# Patient Record
Sex: Female | Born: 1985 | Race: White | Hispanic: No | Marital: Single | Smoking: Current every day smoker
Health system: Southern US, Community
[De-identification: ages and names within clinical notes are randomized; demographics above are authoritative.]

## PROBLEM LIST (undated history)

## (undated) DIAGNOSIS — R011 Cardiac murmur, unspecified: Secondary | ICD-10-CM

## (undated) DIAGNOSIS — F419 Anxiety disorder, unspecified: Secondary | ICD-10-CM

## (undated) HISTORY — DX: Anxiety disorder, unspecified: F41.9

## (undated) HISTORY — PX: EXCISION VAGINAL CYST: SHX5825

---

## 2010-12-11 DIAGNOSIS — F419 Anxiety disorder, unspecified: Secondary | ICD-10-CM | POA: Insufficient documentation

## 2014-02-07 ENCOUNTER — Ambulatory Visit (INDEPENDENT_AMBULATORY_CARE_PROVIDER_SITE_OTHER): Payer: 59 | Admitting: Obstetrics & Gynecology

## 2014-02-07 ENCOUNTER — Encounter: Payer: Self-pay | Admitting: Obstetrics & Gynecology

## 2014-02-07 VITALS — BP 118/69 | HR 86 | Ht 65.0 in | Wt 169.0 lb

## 2014-02-07 DIAGNOSIS — Z3492 Encounter for supervision of normal pregnancy, unspecified, second trimester: Secondary | ICD-10-CM

## 2014-02-07 DIAGNOSIS — L732 Hidradenitis suppurativa: Secondary | ICD-10-CM

## 2014-02-07 DIAGNOSIS — Z118 Encounter for screening for other infectious and parasitic diseases: Secondary | ICD-10-CM

## 2014-02-07 DIAGNOSIS — Z3490 Encounter for supervision of normal pregnancy, unspecified, unspecified trimester: Secondary | ICD-10-CM | POA: Insufficient documentation

## 2014-02-07 DIAGNOSIS — Z1151 Encounter for screening for human papillomavirus (HPV): Secondary | ICD-10-CM

## 2014-02-07 DIAGNOSIS — Z113 Encounter for screening for infections with a predominantly sexual mode of transmission: Secondary | ICD-10-CM

## 2014-02-07 DIAGNOSIS — Z124 Encounter for screening for malignant neoplasm of cervix: Secondary | ICD-10-CM

## 2014-02-07 LAB — OB RESULTS CONSOLE GBS: STREP GROUP B AG: POSITIVE

## 2014-02-07 MED ORDER — PRENATAL VITAMINS PLUS 27-1 MG PO TABS: 1.0000 | ORAL_TABLET | Freq: Every day | ORAL | Status: AC

## 2014-02-07 NOTE — Addendum Note (Signed)
Addended by: Granville LewisLARK, Maziah Smola L on: 02/07/2014 04:32 PM   Modules accepted: Orders

## 2014-02-07 NOTE — Progress Notes (Signed)
Pt unsure of dats.  HC measures 1978w4d today

## 2014-02-07 NOTE — Progress Notes (Signed)
   Subjective:    Jo Rios is a G2P1001 3645w4d being seen today for her first obstetrical visit.  Her obstetrical history is significant for late prenatal care. Patient does intend to breast feed. Pregnancy history fully reviewed.  Patient reports no complaints.  Filed Vitals:   02/07/14 1528 02/07/14 1537  BP: 118/69   Pulse: 86   Height:  5\' 5"  (1.651 m)  Weight: 169 lb (76.658 kg)     HISTORY: OB History  Gravida Para Term Preterm AB SAB TAB Ectopic Multiple Living  2 1 1       1     # Outcome Date GA Lbr Len/2nd Weight Sex Delivery Anes PTL Lv  2 CUR           1 TRM 02/01/05 6449w0d  6 lb 1 oz (2.75 kg) M SVD EPI N Y     Past Medical History  Diagnosis Date  . Anxiety    Past Surgical History  Procedure Laterality Date  . Excision vaginal cyst      2 surgeries   Family History  Problem Relation Age of Onset  . Stroke Maternal Grandfather      Exam    Uterus:     Pelvic Exam:    Perineum: No Hemorrhoids   Vulva: Healed scars from hydradenitis suppurativa   Vagina:  normal mucosa   pH: n/a   Cervix: no cervical motion tenderness and no lesions   Adnexa: normal adnexa   Bony Pelvis: average  System: Breast:  normal appearance, no masses or tenderness   Skin: normal coloration and turgor, no rashes    Neurologic: oriented, normal mood   Extremities: normal strength, tone, and muscle mass   HEENT sclera clear, anicteric, oropharynx clear, no lesions, neck supple with midline trachea and thyroid without masses   Mouth/Teeth mucous membranes moist, pharynx normal without lesions and dental hygiene good   Neck supple and no masses   Cardiovascular: regular rate and rhythm   Respiratory:  appears well, vitals normal, no respiratory distress, acyanotic, normal RR, ear and throat exam is normal, chest clear, no wheezing, crepitations, rhonchi, normal symmetric air entry   Abdomen: soft, non-tender; bowel sounds normal; no masses,  no organomegaly   Urinary: urethral meatus normal      Assessment:    Pregnancy: G2P1001 There are no active problems to display for this patient.       Plan:     Initial labs drawn. Prenatal vitamins. Problem list reviewed and updated. Genetic Screening discussed Quad Screen: declined.  Ultrasound discussed; fetal survey: ordered.  Follow up in 2 weeks. PNV  Bayani Renteria H. 02/07/2014

## 2014-02-08 LAB — OBSTETRIC PANEL
Antibody Screen: NEGATIVE
BASOS PCT: 1 % (ref 0–1)
Basophils Absolute: 0.1 10*3/uL (ref 0.0–0.1)
Eosinophils Absolute: 0.3 10*3/uL (ref 0.0–0.7)
Eosinophils Relative: 4 % (ref 0–5)
HCT: 32.2 % — ABNORMAL LOW (ref 36.0–46.0)
HEP B S AG: NEGATIVE
Hemoglobin: 10.9 g/dL — ABNORMAL LOW (ref 12.0–15.0)
LYMPHS ABS: 1.9 10*3/uL (ref 0.7–4.0)
Lymphocytes Relative: 24 % (ref 12–46)
MCH: 27.9 pg (ref 26.0–34.0)
MCHC: 33.9 g/dL (ref 30.0–36.0)
MCV: 82.6 fL (ref 78.0–100.0)
MONOS PCT: 7 % (ref 3–12)
Monocytes Absolute: 0.6 10*3/uL (ref 0.1–1.0)
NEUTROS ABS: 5.2 10*3/uL (ref 1.7–7.7)
Neutrophils Relative %: 64 % (ref 43–77)
PLATELETS: 215 10*3/uL (ref 150–400)
RBC: 3.9 MIL/uL (ref 3.87–5.11)
RDW: 14.1 % (ref 11.5–15.5)
Rh Type: POSITIVE
Rubella: 1.17 Index — ABNORMAL HIGH (ref <0.90)
WBC: 8.1 10*3/uL (ref 4.0–10.5)

## 2014-02-08 LAB — HIV ANTIBODY (ROUTINE TESTING W REFLEX): HIV 1&2 Ab, 4th Generation: NONREACTIVE

## 2014-02-09 LAB — CULTURE, URINE COMPREHENSIVE
Colony Count: NO GROWTH
Organism ID, Bacteria: NO GROWTH

## 2014-02-12 LAB — CYTOLOGY - PAP

## 2014-02-19 ENCOUNTER — Ambulatory Visit (HOSPITAL_COMMUNITY): Payer: 59

## 2014-03-01 ENCOUNTER — Other Ambulatory Visit: Payer: Self-pay | Admitting: Obstetrics & Gynecology

## 2014-03-01 ENCOUNTER — Ambulatory Visit (HOSPITAL_COMMUNITY)
Admission: RE | Admit: 2014-03-01 | Discharge: 2014-03-01 | Disposition: A | Discharge status: Home / Self Care | Payer: 59 | Source: Ambulatory Visit | Attending: Obstetrics & Gynecology | Admitting: Obstetrics & Gynecology

## 2014-03-01 DIAGNOSIS — Z3689 Encounter for other specified antenatal screening: Secondary | ICD-10-CM

## 2014-03-01 DIAGNOSIS — Z3A21 21 weeks gestation of pregnancy: Secondary | ICD-10-CM | POA: Diagnosis not present

## 2014-03-01 DIAGNOSIS — Z3492 Encounter for supervision of normal pregnancy, unspecified, second trimester: Secondary | ICD-10-CM

## 2014-03-01 DIAGNOSIS — Z36 Encounter for antenatal screening of mother: Secondary | ICD-10-CM | POA: Insufficient documentation

## 2014-03-05 ENCOUNTER — Encounter: Payer: Self-pay | Admitting: Obstetrics & Gynecology

## 2014-03-08 ENCOUNTER — Ambulatory Visit (INDEPENDENT_AMBULATORY_CARE_PROVIDER_SITE_OTHER): Payer: 59 | Admitting: Family

## 2014-03-08 VITALS — BP 116/71 | HR 91 | Wt 172.0 lb

## 2014-03-08 DIAGNOSIS — Z3492 Encounter for supervision of normal pregnancy, unspecified, second trimester: Secondary | ICD-10-CM

## 2014-03-08 NOTE — Progress Notes (Signed)
Reports increased leg pain on left hip with increased sitting at work.  Denies change in bowel pattern, no vaginal bleeding or leaking of fluid.  Reviewed ultrasound results (limited view of heart) desires repeat in 4 weeks; schedule next visit.

## 2014-03-08 NOTE — Progress Notes (Signed)
Pt states she has increased left hip pain

## 2014-04-05 ENCOUNTER — Encounter: Payer: 59 | Admitting: Family

## 2014-04-16 ENCOUNTER — Ambulatory Visit (INDEPENDENT_AMBULATORY_CARE_PROVIDER_SITE_OTHER): Payer: 59 | Admitting: Obstetrics & Gynecology

## 2014-04-16 VITALS — BP 135/63 | HR 101 | Wt 178.0 lb

## 2014-04-16 DIAGNOSIS — Z23 Encounter for immunization: Secondary | ICD-10-CM

## 2014-04-16 DIAGNOSIS — Z3493 Encounter for supervision of normal pregnancy, unspecified, third trimester: Secondary | ICD-10-CM

## 2014-04-16 MED ORDER — TETANUS-DIPHTH-ACELL PERTUSSIS 5-2.5-18.5 LF-MCG/0.5 IM SUSP
0.5000 mL | Freq: Once | INTRAMUSCULAR | Status: AC
  Administered 2014-04-16: 0.5 mL via INTRAMUSCULAR

## 2014-04-16 NOTE — Addendum Note (Signed)
Addended by: Granville LewisLARK, Cane Dubray L on: 04/16/2014 04:30 PM   Modules accepted: Orders

## 2014-04-16 NOTE — Progress Notes (Signed)
Routine visit. Good FM. No problems. Glucola, labs, TDAP today. She declines flu vaccine. She is aware that this is recommeded.

## 2014-04-17 ENCOUNTER — Telehealth: Payer: Self-pay | Admitting: *Deleted

## 2014-04-17 DIAGNOSIS — R7309 Other abnormal glucose: Secondary | ICD-10-CM

## 2014-04-17 LAB — CBC
HCT: 28.3 % — ABNORMAL LOW (ref 36.0–46.0)
Hemoglobin: 9.5 g/dL — ABNORMAL LOW (ref 12.0–15.0)
MCH: 28.4 pg (ref 26.0–34.0)
MCHC: 33.6 g/dL (ref 30.0–36.0)
MCV: 84.7 fL (ref 78.0–100.0)
MPV: 10.7 fL (ref 9.4–12.4)
Platelets: 179 10*3/uL (ref 150–400)
RBC: 3.34 MIL/uL — AB (ref 3.87–5.11)
RDW: 13.5 % (ref 11.5–15.5)
WBC: 6.8 10*3/uL (ref 4.0–10.5)

## 2014-04-17 LAB — GLUCOSE TOLERANCE, 1 HOUR (50G) W/O FASTING: Glucose, 1 Hour GTT: 136 mg/dL (ref 70–140)

## 2014-04-17 LAB — RPR

## 2014-04-17 LAB — HIV ANTIBODY (ROUTINE TESTING W REFLEX): HIV: NONREACTIVE

## 2014-04-17 NOTE — Telephone Encounter (Signed)
Patient notified of abnormal 1 hr GTT and is to schedule a fasting 3 hr GTT

## 2014-04-18 ENCOUNTER — Telehealth: Payer: Self-pay | Admitting: *Deleted

## 2014-04-18 NOTE — Telephone Encounter (Signed)
Pt notified of low Hgb.  She is to start FeS04 daily in conjunction with her PNV and add Colace 2 tabs daily.  She is scheduled for her 3 hr GTT on 04/24/14

## 2014-04-24 ENCOUNTER — Other Ambulatory Visit: Payer: Self-pay | Admitting: *Deleted

## 2014-04-24 DIAGNOSIS — R110 Nausea: Secondary | ICD-10-CM

## 2014-04-24 MED ORDER — ONDANSETRON HCL 4 MG PO TABS: 4.0000 mg | ORAL_TABLET | Freq: Three times a day (TID) | ORAL | Status: DC | PRN

## 2014-04-24 NOTE — Telephone Encounter (Signed)
Pt requesting RF on Zofran .  RF authorization sent to pharmacy

## 2014-04-25 LAB — GLUCOSE TOLERANCE, 3 HOURS
GLUCOSE, 1 HOUR-GESTATIONAL: 144 mg/dL (ref 70–189)
GLUCOSE, 2 HOUR-GESTATIONAL: 125 mg/dL (ref 70–164)
GLUCOSE, FASTING-GESTATIONAL: 76 mg/dL (ref 70–104)
Glucose, GTT - 3 Hour: 59 mg/dL — ABNORMAL LOW (ref 70–144)

## 2014-04-29 ENCOUNTER — Telehealth: Payer: Self-pay | Admitting: *Deleted

## 2014-04-29 NOTE — Telephone Encounter (Signed)
LM on voicemail of normal 3 hr GTT results.

## 2014-05-07 ENCOUNTER — Encounter: Payer: Self-pay | Admitting: Obstetrics & Gynecology

## 2014-05-07 ENCOUNTER — Ambulatory Visit (INDEPENDENT_AMBULATORY_CARE_PROVIDER_SITE_OTHER): Payer: 59 | Admitting: Obstetrics & Gynecology

## 2014-05-07 VITALS — BP 106/67 | HR 90 | Wt 182.0 lb

## 2014-05-07 DIAGNOSIS — Z3493 Encounter for supervision of normal pregnancy, unspecified, third trimester: Secondary | ICD-10-CM

## 2014-05-07 NOTE — Progress Notes (Signed)
Routine visit. Good FM. No problems. She declines a follow up ultrasound.

## 2014-05-28 ENCOUNTER — Ambulatory Visit (INDEPENDENT_AMBULATORY_CARE_PROVIDER_SITE_OTHER): Payer: 59 | Admitting: Obstetrics & Gynecology

## 2014-05-28 ENCOUNTER — Encounter: Payer: Self-pay | Admitting: *Deleted

## 2014-05-28 VITALS — BP 109/60 | HR 86 | Wt 186.0 lb

## 2014-05-28 DIAGNOSIS — Z3490 Encounter for supervision of normal pregnancy, unspecified, unspecified trimester: Secondary | ICD-10-CM

## 2014-06-11 ENCOUNTER — Encounter: Payer: Self-pay | Admitting: Obstetrics & Gynecology

## 2014-06-11 ENCOUNTER — Encounter: Payer: Self-pay | Admitting: *Deleted

## 2014-06-11 ENCOUNTER — Ambulatory Visit (INDEPENDENT_AMBULATORY_CARE_PROVIDER_SITE_OTHER): Payer: 59 | Admitting: Obstetrics & Gynecology

## 2014-06-11 ENCOUNTER — Other Ambulatory Visit: Payer: Self-pay | Admitting: Obstetrics & Gynecology

## 2014-06-11 VITALS — BP 120/72 | HR 82 | Wt 188.0 lb

## 2014-06-11 DIAGNOSIS — Z3493 Encounter for supervision of normal pregnancy, unspecified, third trimester: Secondary | ICD-10-CM

## 2014-06-11 DIAGNOSIS — Z36 Encounter for antenatal screening of mother: Secondary | ICD-10-CM

## 2014-06-11 LAB — OB RESULTS CONSOLE GC/CHLAMYDIA
Chlamydia: NEGATIVE
Gonorrhea: NEGATIVE

## 2014-06-11 NOTE — Progress Notes (Signed)
Routine visit. Good FM. No problems. Cervical cultures today. Labor precautions reviewed. 

## 2014-06-12 LAB — CULTURE, BETA STREP (GROUP B ONLY)

## 2014-06-12 LAB — GC/CHLAMYDIA PROBE AMP
CT PROBE, AMP APTIMA: NEGATIVE
GC Probe RNA: NEGATIVE

## 2014-06-20 ENCOUNTER — Encounter: Payer: 59 | Admitting: Obstetrics & Gynecology

## 2014-06-26 ENCOUNTER — Ambulatory Visit (INDEPENDENT_AMBULATORY_CARE_PROVIDER_SITE_OTHER): Payer: 59 | Admitting: Obstetrics & Gynecology

## 2014-06-26 ENCOUNTER — Encounter: Payer: Self-pay | Admitting: Obstetrics & Gynecology

## 2014-06-26 VITALS — BP 111/63 | HR 80 | Wt 196.0 lb

## 2014-06-26 DIAGNOSIS — O368131 Decreased fetal movements, third trimester, fetus 1: Secondary | ICD-10-CM

## 2014-06-26 DIAGNOSIS — Z3493 Encounter for supervision of normal pregnancy, unspecified, third trimester: Secondary | ICD-10-CM

## 2014-06-26 NOTE — Progress Notes (Signed)
Seeing some bloody show today and would like cervix checked

## 2014-06-26 NOTE — Progress Notes (Signed)
Routine visit. Somewhat decreased FM but still getting 10 movements early every morning. She has had some bloody mucous for about 3 days. NST Labor precautions reviewed.

## 2014-06-28 ENCOUNTER — Inpatient Hospital Stay (HOSPITAL_COMMUNITY)
Admission: AD | Admit: 2014-06-28 | Discharge: 2014-07-02 | DRG: 765 | Disposition: A | Discharge status: Home / Self Care | Payer: 59 | Source: Ambulatory Visit | Attending: Obstetrics and Gynecology | Admitting: Obstetrics and Gynecology

## 2014-06-28 ENCOUNTER — Encounter (HOSPITAL_COMMUNITY): Payer: Self-pay | Admitting: *Deleted

## 2014-06-28 DIAGNOSIS — Z888 Allergy status to other drugs, medicaments and biological substances status: Secondary | ICD-10-CM

## 2014-06-28 DIAGNOSIS — Z87891 Personal history of nicotine dependence: Secondary | ICD-10-CM

## 2014-06-28 DIAGNOSIS — F419 Anxiety disorder, unspecified: Secondary | ICD-10-CM | POA: Diagnosis present

## 2014-06-28 DIAGNOSIS — D649 Anemia, unspecified: Secondary | ICD-10-CM | POA: Diagnosis not present

## 2014-06-28 DIAGNOSIS — O411230 Chorioamnionitis, third trimester, not applicable or unspecified: Secondary | ICD-10-CM | POA: Diagnosis present

## 2014-06-28 DIAGNOSIS — O9081 Anemia of the puerperium: Secondary | ICD-10-CM | POA: Diagnosis not present

## 2014-06-28 DIAGNOSIS — O4593 Premature separation of placenta, unspecified, third trimester: Secondary | ICD-10-CM | POA: Diagnosis present

## 2014-06-28 DIAGNOSIS — O9972 Diseases of the skin and subcutaneous tissue complicating childbirth: Secondary | ICD-10-CM | POA: Diagnosis present

## 2014-06-28 DIAGNOSIS — Z3A38 38 weeks gestation of pregnancy: Secondary | ICD-10-CM | POA: Diagnosis present

## 2014-06-28 DIAGNOSIS — O99824 Streptococcus B carrier state complicating childbirth: Secondary | ICD-10-CM | POA: Diagnosis present

## 2014-06-28 DIAGNOSIS — O99344 Other mental disorders complicating childbirth: Secondary | ICD-10-CM | POA: Diagnosis present

## 2014-06-28 DIAGNOSIS — L732 Hidradenitis suppurativa: Secondary | ICD-10-CM | POA: Diagnosis present

## 2014-06-28 HISTORY — DX: Cardiac murmur, unspecified: R01.1

## 2014-06-28 LAB — CBC
HCT: 29.9 % — ABNORMAL LOW (ref 36.0–46.0)
HEMOGLOBIN: 9.8 g/dL — AB (ref 12.0–15.0)
MCH: 28 pg (ref 26.0–34.0)
MCHC: 32.8 g/dL (ref 30.0–36.0)
MCV: 85.4 fL (ref 78.0–100.0)
Platelets: 146 10*3/uL — ABNORMAL LOW (ref 150–400)
RBC: 3.5 MIL/uL — ABNORMAL LOW (ref 3.87–5.11)
RDW: 13.7 % (ref 11.5–15.5)
WBC: 17.3 10*3/uL — ABNORMAL HIGH (ref 4.0–10.5)

## 2014-06-28 LAB — WET PREP, GENITAL
Clue Cells Wet Prep HPF POC: NONE SEEN
Trich, Wet Prep: NONE SEEN
Yeast Wet Prep HPF POC: NONE SEEN

## 2014-06-28 LAB — PREPARE RBC (CROSSMATCH)

## 2014-06-28 MED ORDER — SODIUM CHLORIDE 0.9 % IV SOLN: Freq: Once | INTRAVENOUS | Status: DC

## 2014-06-28 MED ORDER — LACTATED RINGERS IV SOLN
500.0000 mL | Freq: Once | INTRAVENOUS | Status: AC
  Administered 2014-06-28: 500 mL via INTRAVENOUS

## 2014-06-28 MED ORDER — LORAZEPAM 2 MG/ML IJ SOLN
1.0000 mg | Freq: Once | INTRAMUSCULAR | Status: AC
  Administered 2014-06-28: 1 mg via INTRAVENOUS
  Filled 2014-06-28: qty 0.5

## 2014-06-28 MED ORDER — OXYCODONE-ACETAMINOPHEN 5-325 MG PO TABS: 1.0000 | ORAL_TABLET | ORAL | Status: DC | PRN

## 2014-06-28 MED ORDER — FENTANYL 2.5 MCG/ML BUPIVACAINE 1/10 % EPIDURAL INFUSION (WH - ANES)
14.0000 mL/h | INTRAMUSCULAR | Status: DC | PRN
  Administered 2014-06-29: 14 mL/h via EPIDURAL
  Filled 2014-06-28: qty 125

## 2014-06-28 MED ORDER — PENICILLIN G POTASSIUM 5000000 UNITS IJ SOLR
2.5000 10*6.[IU] | INTRAVENOUS | Status: DC
  Administered 2014-06-29: 2.5 10*6.[IU] via INTRAVENOUS
  Filled 2014-06-28 (×4): qty 2.5

## 2014-06-28 MED ORDER — EPHEDRINE 5 MG/ML INJ: 10.0000 mg | INTRAVENOUS | Status: DC | PRN

## 2014-06-28 MED ORDER — DIPHENHYDRAMINE HCL 50 MG/ML IJ SOLN: 12.5000 mg | INTRAMUSCULAR | Status: DC | PRN

## 2014-06-28 MED ORDER — PHENYLEPHRINE 40 MCG/ML (10ML) SYRINGE FOR IV PUSH (FOR BLOOD PRESSURE SUPPORT)
80.0000 ug | PREFILLED_SYRINGE | INTRAVENOUS | Status: DC | PRN
  Administered 2014-06-29: 80 ug via INTRAVENOUS

## 2014-06-28 MED ORDER — OXYTOCIN 40 UNITS IN LACTATED RINGERS INFUSION - SIMPLE MED: 62.5000 mL/h | INTRAVENOUS | Status: DC

## 2014-06-28 MED ORDER — PENICILLIN G POTASSIUM 5000000 UNITS IJ SOLR
5.0000 10*6.[IU] | Freq: Once | INTRAMUSCULAR | Status: AC
  Administered 2014-06-28: 5 10*6.[IU] via INTRAVENOUS
  Filled 2014-06-28: qty 5

## 2014-06-28 MED ORDER — LACTATED RINGERS IV SOLN
500.0000 mL | INTRAVENOUS | Status: DC | PRN
  Administered 2014-06-29: 500 mL via INTRAVENOUS

## 2014-06-28 MED ORDER — OXYTOCIN BOLUS FROM INFUSION: 500.0000 mL | INTRAVENOUS | Status: DC

## 2014-06-28 MED ORDER — ONDANSETRON HCL 4 MG/2ML IJ SOLN: 4.0000 mg | Freq: Four times a day (QID) | INTRAMUSCULAR | Status: DC | PRN

## 2014-06-28 MED ORDER — PHENYLEPHRINE 40 MCG/ML (10ML) SYRINGE FOR IV PUSH (FOR BLOOD PRESSURE SUPPORT)
80.0000 ug | PREFILLED_SYRINGE | INTRAVENOUS | Status: DC | PRN
  Filled 2014-06-28 (×2): qty 20

## 2014-06-28 MED ORDER — LACTATED RINGERS IV SOLN
INTRAVENOUS | Status: DC
  Administered 2014-06-28 – 2014-06-29 (×3): via INTRAVENOUS

## 2014-06-28 MED ORDER — ACETAMINOPHEN 325 MG PO TABS
650.0000 mg | ORAL_TABLET | ORAL | Status: DC | PRN
Start: 2014-06-28 — End: 2014-06-29

## 2014-06-28 MED ORDER — HYDROXYZINE HCL 25 MG PO TABS
25.0000 mg | ORAL_TABLET | Freq: Three times a day (TID) | ORAL | Status: DC | PRN
  Filled 2014-06-28: qty 1

## 2014-06-28 MED ORDER — LIDOCAINE HCL (PF) 1 % IJ SOLN: 30.0000 mL | INTRAMUSCULAR | Status: DC | PRN

## 2014-06-28 MED ORDER — FENTANYL CITRATE 0.05 MG/ML IJ SOLN
100.0000 ug | INTRAMUSCULAR | Status: DC | PRN
  Administered 2014-06-28: 100 ug via INTRAVENOUS
  Filled 2014-06-28: qty 2

## 2014-06-28 MED ORDER — CITRIC ACID-SODIUM CITRATE 334-500 MG/5ML PO SOLN
30.0000 mL | ORAL | Status: DC | PRN
  Administered 2014-06-29: 30 mL via ORAL
  Filled 2014-06-28: qty 15

## 2014-06-28 MED ORDER — OXYCODONE-ACETAMINOPHEN 5-325 MG PO TABS: 2.0000 | ORAL_TABLET | ORAL | Status: DC | PRN

## 2014-06-28 NOTE — H&P (Signed)
LABOR ADMISSION HISTORY AND PHYSICAL  Jo Rios is a 29 y.o. female G2P1001 with IUP at [redacted]w[redacted]d by LMP c/w [redacted]w[redacted]d sono presenting for labor evaluation now with vaginal bleeding. She reports +FMs, No LOF, no VB, no blurry vision, headaches or peripheral edema, and RUQ pain.  She plans on bottle feeding. She is undecided for birth control.  Dating: By [redacted]w[redacted]d --->  Estimated Date of Delivery: 07/07/14   Prenatal History/Complications:  Past Medical History: Past Medical History  Diagnosis Date  . Anxiety     Past Surgical History: Past Surgical History  Procedure Laterality Date  . Excision vaginal cyst      2 surgeries    Obstetrical History: OB History    Gravida Para Term Preterm AB TAB SAB Ectopic Multiple Living   Social History: History   Social History  . Marital Status: Single    Spouse Name: N/A  . Number of Children: N/A  . Years of Education: N/A   Occupational History  . customer service    Social History Main Topics  . Smoking status: Former Smoker -- 0.50 packs/day for 10 years    Types: Cigarettes  . Smokeless tobacco: Never Used  . Alcohol Use: No  . Drug Use: No  . Sexual Activity:    Partners: Male   Other Topics Concern  . None   Social History Narrative    Family History: Family History  Problem Relation Age of Onset  . Stroke Maternal Grandfather     Allergies: Allergies  Allergen Reactions  . Guaifenesin & Derivatives Hives    Prescriptions prior to admission  Medication Sig Dispense Refill Last Dose  . acetaminophen (TYLENOL) 325 MG tablet Take 325 mg by mouth every 6 (six) hours as needed for moderate pain.   06/28/2014 at Unknown time  . ondansetron (ZOFRAN) 4 MG tablet Take 1 tablet (4 mg total) by mouth every 8 (eight) hours as needed for nausea or vomiting. 20 tablet 0 Past Month at Unknown time  . Prenatal Vit-Fe Fumarate-FA (PRENATAL VITAMINS PLUS) 27-1 MG TABS Take 1 tablet by mouth daily. 30  tablet 12 06/28/2014 at Unknown time     Review of Systems   All systems reviewed and negative except as stated in HPI  Blood pressure 137/73, pulse 105, temperature 98.2 F (36.8 C), temperature source Oral, resp. rate 18, height  (1.727 m), weight 194 lb (87.998 kg), last menstrual period 10/04/2013. General appearance: alert, cooperative and moderate distress with contractions Lungs: clear to auscultation bilaterally Heart: regular rate and rhythm Abdomen: soft, non-tender; bowel sounds normal Extremities: Homans sign is negative, no sign of DVT Presentation: cephalic Fetal monitoringBaseline: 140 bpm, Variability: Good {> 6 bpm), Accelerations: Reactive and Decelerations: few lates noted, resolved with position changes Uterine activityq3-67min  Dilation: 2 Effacement (%): 60 Exam by:: Dr. Loreta Ave   Prenatal labs: ABO, Rh: O/POS/-- (10/08 1636) Antibody: NEG (10/08 1636) Rubella:   RPR: NON REAC (12/15 1645)  HBsAg: NEGATIVE (10/08 1636)  HIV: NONREACTIVE (12/15 1645)  GBS: Positive (10/08 0000)  1 hr Glucola 136 => 76/144/125/59 Genetic screening  declined Anatomy US limited views of the heart   [facility] [place] Prenatal Labs  Dating  18 wk ultrasound completed by Mariel Aloe (see note) Blood type: O/POS/-- (10/08 1636)  Genetic Screen declined Antibody:NEG (10/08 1636)  Anatomic [place] nml with limited views of the heart (mfm recommends f/u--discuss with  patient); discussed with patient will obtain at 26 wks Rubella: 1.17 (10/08 1636)  GTT Ear    Normal 3 hour GTT           RPR: NON REAC (10/08 1636)   TDaP vaccine  12/15 HBsAg: NEGATIVE (10/08 1636)   Flu vaccine  HIV: NONREACTIVE (10/08 1636)   GBS  positive GBS:   Contraception  Pap: Neg; GC/CT neg x 2  Baby Food    Circumcision    Pediatrician    Support Person         Results for orders placed or performed during the hospital encounter of 06/28/14 (from the past 24 hour(s))  Wet prep, genital   Collection  Time: 06/28/14  7:40 PM  Result Value Ref Range   Yeast Wet Prep HPF POC NONE SEEN NONE SEEN   Trich, Wet Prep NONE SEEN NONE SEEN   Clue Cells Wet Prep HPF POC NONE SEEN NONE SEEN   WBC, Wet Prep HPF POC TOO NUMEROUS TO COUNT (A) NONE SEEN    Patient Active Problem List   Diagnosis Date Noted  . Placental abruption in third trimester 06/28/2014  . Hidradenitis suppurativa 02/07/2014  . Supervision of low-risk pregnancy 02/07/2014  . Anxiety 12/11/2010    Assessment: Jo Rios is a 29 y.o. G2P1001 at 2864w5d here for labor evaluation, now with possible marginal abruption  #Labor: expectant mgmt, will augment if necessary #Pain: Epidural upon request #FWB: Cat II with late decelerations that resolved with position changes => monitor very closely #ID:  GBS + => PCN #MOF: bottle #MOC:undecided   Jo Rios ROCIO 06/28/2014, 8:57 PM

## 2014-06-28 NOTE — MAU Note (Signed)
Pt presents to MAU with complaints of contractions since this morning. Reports bloody mucous since this evening.

## 2014-06-29 ENCOUNTER — Encounter (HOSPITAL_COMMUNITY)
Admission: AD | Disposition: A | Discharge status: Home / Self Care | Payer: Self-pay | Source: Ambulatory Visit | Attending: Obstetrics and Gynecology

## 2014-06-29 ENCOUNTER — Inpatient Hospital Stay (HOSPITAL_COMMUNITY): Payer: 59 | Admitting: Anesthesiology

## 2014-06-29 ENCOUNTER — Encounter (HOSPITAL_COMMUNITY): Payer: Self-pay | Admitting: *Deleted

## 2014-06-29 DIAGNOSIS — Z3A38 38 weeks gestation of pregnancy: Secondary | ICD-10-CM

## 2014-06-29 DIAGNOSIS — O99824 Streptococcus B carrier state complicating childbirth: Secondary | ICD-10-CM

## 2014-06-29 DIAGNOSIS — O411230 Chorioamnionitis, third trimester, not applicable or unspecified: Secondary | ICD-10-CM

## 2014-06-29 LAB — ABO/RH: ABO/RH(D): O POS

## 2014-06-29 LAB — CBC
HEMATOCRIT: 23 % — AB (ref 36.0–46.0)
HEMOGLOBIN: 7.7 g/dL — AB (ref 12.0–15.0)
MCH: 28.6 pg (ref 26.0–34.0)
MCHC: 33.5 g/dL (ref 30.0–36.0)
MCV: 85.5 fL (ref 78.0–100.0)
Platelets: 140 10*3/uL — ABNORMAL LOW (ref 150–400)
RBC: 2.69 MIL/uL — AB (ref 3.87–5.11)
RDW: 13.7 % (ref 11.5–15.5)
WBC: 18.3 10*3/uL — ABNORMAL HIGH (ref 4.0–10.5)

## 2014-06-29 SURGERY — Surgical Case
Anesthesia: Epidural | Site: Abdomen

## 2014-06-29 MED ORDER — MEPERIDINE HCL 25 MG/ML IJ SOLN: 6.2500 mg | INTRAMUSCULAR | Status: DC | PRN

## 2014-06-29 MED ORDER — IBUPROFEN 600 MG PO TABS
600.0000 mg | ORAL_TABLET | Freq: Four times a day (QID) | ORAL | Status: DC
  Administered 2014-06-29 – 2014-07-02 (×12): 600 mg via ORAL
  Filled 2014-06-29 (×12): qty 1

## 2014-06-29 MED ORDER — NALOXONE HCL 0.4 MG/ML IJ SOLN: 0.4000 mg | INTRAMUSCULAR | Status: DC | PRN

## 2014-06-29 MED ORDER — LACTATED RINGERS IV SOLN
INTRAVENOUS | Status: DC
  Administered 2014-06-29 – 2014-06-30 (×2): via INTRAVENOUS

## 2014-06-29 MED ORDER — LANOLIN HYDROUS EX OINT: 1.0000 "application " | TOPICAL_OINTMENT | CUTANEOUS | Status: DC | PRN

## 2014-06-29 MED ORDER — ERYTHROMYCIN 5 MG/GM OP OINT
TOPICAL_OINTMENT | OPHTHALMIC | Status: AC
  Filled 2014-06-29: qty 1

## 2014-06-29 MED ORDER — BUPIVACAINE HCL (PF) 0.25 % IJ SOLN
INTRAMUSCULAR | Status: DC | PRN
  Administered 2014-06-29 (×2): 4 mL via EPIDURAL

## 2014-06-29 MED ORDER — SIMETHICONE 80 MG PO CHEW: 80.0000 mg | CHEWABLE_TABLET | ORAL | Status: DC | PRN

## 2014-06-29 MED ORDER — NALOXONE HCL 1 MG/ML IJ SOLN
1.0000 ug/kg/h | INTRAVENOUS | Status: DC | PRN
  Filled 2014-06-29: qty 2

## 2014-06-29 MED ORDER — MENTHOL 3 MG MT LOZG: 1.0000 | LOZENGE | OROMUCOSAL | Status: DC | PRN

## 2014-06-29 MED ORDER — FENTANYL CITRATE 0.05 MG/ML IJ SOLN: 25.0000 ug | INTRAMUSCULAR | Status: DC | PRN

## 2014-06-29 MED ORDER — PHENYLEPHRINE 40 MCG/ML (10ML) SYRINGE FOR IV PUSH (FOR BLOOD PRESSURE SUPPORT)
PREFILLED_SYRINGE | INTRAVENOUS | Status: AC
  Filled 2014-06-29: qty 20

## 2014-06-29 MED ORDER — MEPERIDINE HCL 25 MG/ML IJ SOLN
INTRAMUSCULAR | Status: DC | PRN
  Administered 2014-06-29 (×2): 12.5 mg via INTRAVENOUS

## 2014-06-29 MED ORDER — TETANUS-DIPHTH-ACELL PERTUSSIS 5-2.5-18.5 LF-MCG/0.5 IM SUSP: 0.5000 mL | Freq: Once | INTRAMUSCULAR | Status: DC

## 2014-06-29 MED ORDER — NALBUPHINE HCL 10 MG/ML IJ SOLN: 5.0000 mg | Freq: Once | INTRAMUSCULAR | Status: AC | PRN

## 2014-06-29 MED ORDER — ONDANSETRON HCL 4 MG/2ML IJ SOLN
INTRAMUSCULAR | Status: DC | PRN
  Administered 2014-06-29: 4 mg via INTRAVENOUS

## 2014-06-29 MED ORDER — ONDANSETRON HCL 4 MG/2ML IJ SOLN
INTRAMUSCULAR | Status: AC
  Filled 2014-06-29: qty 2

## 2014-06-29 MED ORDER — SIMETHICONE 80 MG PO CHEW
80.0000 mg | CHEWABLE_TABLET | Freq: Three times a day (TID) | ORAL | Status: DC
  Administered 2014-06-29 – 2014-07-02 (×9): 80 mg via ORAL
  Filled 2014-06-29 (×9): qty 1

## 2014-06-29 MED ORDER — WITCH HAZEL-GLYCERIN EX PADS: 1.0000 "application " | MEDICATED_PAD | CUTANEOUS | Status: DC | PRN

## 2014-06-29 MED ORDER — TERBUTALINE SULFATE 1 MG/ML IJ SOLN
0.2500 mg | Freq: Once | INTRAMUSCULAR | Status: AC | PRN
  Administered 2014-06-29: 0.25 mg via SUBCUTANEOUS
  Filled 2014-06-29: qty 1

## 2014-06-29 MED ORDER — DIPHENHYDRAMINE HCL 50 MG/ML IJ SOLN: 12.5000 mg | INTRAMUSCULAR | Status: DC | PRN

## 2014-06-29 MED ORDER — LACTATED RINGERS IV SOLN
INTRAVENOUS | Status: DC | PRN
  Administered 2014-06-29 (×3): via INTRAVENOUS

## 2014-06-29 MED ORDER — GENTAMICIN SULFATE 40 MG/ML IJ SOLN
Freq: Three times a day (TID) | INTRAVENOUS | Status: AC
  Administered 2014-06-29 – 2014-06-30 (×3): via INTRAVENOUS
  Filled 2014-06-29 (×3): qty 4.5

## 2014-06-29 MED ORDER — SODIUM CHLORIDE 0.9 % IJ SOLN
3.0000 mL | INTRAMUSCULAR | Status: DC | PRN
  Administered 2014-06-30: 3 mL via INTRAVENOUS
  Filled 2014-06-29: qty 3

## 2014-06-29 MED ORDER — SCOPOLAMINE 1 MG/3DAYS TD PT72
MEDICATED_PATCH | TRANSDERMAL | Status: DC | PRN
  Administered 2014-06-29: 1 via TRANSDERMAL

## 2014-06-29 MED ORDER — OXYTOCIN 40 UNITS IN LACTATED RINGERS INFUSION - SIMPLE MED: 62.5000 mL/h | INTRAVENOUS | Status: AC

## 2014-06-29 MED ORDER — PHENYLEPHRINE HCL 10 MG/ML IJ SOLN
INTRAMUSCULAR | Status: DC | PRN
  Administered 2014-06-29 (×9): 80 ug via INTRAVENOUS

## 2014-06-29 MED ORDER — ONDANSETRON HCL 4 MG/2ML IJ SOLN: 4.0000 mg | INTRAMUSCULAR | Status: DC | PRN

## 2014-06-29 MED ORDER — CEFAZOLIN SODIUM-DEXTROSE 2-3 GM-% IV SOLR
INTRAVENOUS | Status: AC
  Filled 2014-06-29: qty 50

## 2014-06-29 MED ORDER — DIBUCAINE 1 % RE OINT: 1.0000 "application " | TOPICAL_OINTMENT | RECTAL | Status: DC | PRN

## 2014-06-29 MED ORDER — SCOPOLAMINE 1 MG/3DAYS TD PT72
MEDICATED_PATCH | TRANSDERMAL | Status: AC
  Filled 2014-06-29: qty 1

## 2014-06-29 MED ORDER — DIPHENHYDRAMINE HCL 25 MG PO CAPS: 25.0000 mg | ORAL_CAPSULE | Freq: Four times a day (QID) | ORAL | Status: DC | PRN

## 2014-06-29 MED ORDER — NALBUPHINE HCL 10 MG/ML IJ SOLN
5.0000 mg | Freq: Once | INTRAMUSCULAR | Status: AC | PRN
Start: 2014-06-29 — End: 2014-06-29

## 2014-06-29 MED ORDER — FENTANYL 2.5 MCG/ML BUPIVACAINE 1/10 % EPIDURAL INFUSION (WH - ANES)
INTRAMUSCULAR | Status: DC | PRN
  Administered 2014-06-29: 12 mL/h via EPIDURAL

## 2014-06-29 MED ORDER — CEFAZOLIN SODIUM-DEXTROSE 2-3 GM-% IV SOLR
INTRAVENOUS | Status: DC | PRN
  Administered 2014-06-29: 2 g via INTRAVENOUS

## 2014-06-29 MED ORDER — OXYTOCIN 10 UNIT/ML IJ SOLN
INTRAMUSCULAR | Status: AC
  Filled 2014-06-29: qty 4

## 2014-06-29 MED ORDER — OXYTOCIN 40 UNITS IN LACTATED RINGERS INFUSION - SIMPLE MED
1.0000 m[IU]/min | INTRAVENOUS | Status: DC
  Administered 2014-06-29: 2 m[IU]/min via INTRAVENOUS
  Filled 2014-06-29: qty 1000

## 2014-06-29 MED ORDER — KETOROLAC TROMETHAMINE 30 MG/ML IJ SOLN: 30.0000 mg | Freq: Four times a day (QID) | INTRAMUSCULAR | Status: DC | PRN

## 2014-06-29 MED ORDER — LIDOCAINE-EPINEPHRINE (PF) 1.5 %-1:200000 IJ SOLN
INTRAMUSCULAR | Status: DC | PRN
  Administered 2014-06-29: 3 mL

## 2014-06-29 MED ORDER — SENNOSIDES-DOCUSATE SODIUM 8.6-50 MG PO TABS
2.0000 | ORAL_TABLET | ORAL | Status: DC
  Administered 2014-06-30 – 2014-07-02 (×3): 2 via ORAL
  Filled 2014-06-29 (×3): qty 2

## 2014-06-29 MED ORDER — PRENATAL MULTIVITAMIN CH
1.0000 | ORAL_TABLET | Freq: Every day | ORAL | Status: DC
  Administered 2014-06-29 – 2014-07-02 (×4): 1 via ORAL
  Filled 2014-06-29 (×4): qty 1

## 2014-06-29 MED ORDER — METOCLOPRAMIDE HCL 5 MG/ML IJ SOLN
INTRAMUSCULAR | Status: DC | PRN
  Administered 2014-06-29: 10 mg via INTRAVENOUS

## 2014-06-29 MED ORDER — SIMETHICONE 80 MG PO CHEW
80.0000 mg | CHEWABLE_TABLET | ORAL | Status: DC
  Administered 2014-06-30 – 2014-07-02 (×3): 80 mg via ORAL
  Filled 2014-06-29 (×3): qty 1

## 2014-06-29 MED ORDER — ONDANSETRON HCL 4 MG PO TABS: 4.0000 mg | ORAL_TABLET | ORAL | Status: DC | PRN

## 2014-06-29 MED ORDER — SODIUM BICARBONATE 8.4 % IV SOLN
INTRAVENOUS | Status: DC | PRN
  Administered 2014-06-29 (×5): 5 mL via EPIDURAL

## 2014-06-29 MED ORDER — LIDOCAINE-EPINEPHRINE 2 %-1:100000 IJ SOLN
INTRAMUSCULAR | Status: DC | PRN
  Administered 2014-06-29: 5 mL via INTRADERMAL

## 2014-06-29 MED ORDER — DIPHENHYDRAMINE HCL 25 MG PO CAPS: 25.0000 mg | ORAL_CAPSULE | ORAL | Status: DC | PRN

## 2014-06-29 MED ORDER — NALBUPHINE HCL 10 MG/ML IJ SOLN: 5.0000 mg | INTRAMUSCULAR | Status: DC | PRN

## 2014-06-29 MED ORDER — OXYCODONE-ACETAMINOPHEN 5-325 MG PO TABS
2.0000 | ORAL_TABLET | ORAL | Status: DC | PRN
  Administered 2014-06-29 – 2014-07-02 (×9): 2 via ORAL
  Filled 2014-06-29 (×9): qty 2

## 2014-06-29 MED ORDER — MORPHINE SULFATE 0.5 MG/ML IJ SOLN
INTRAMUSCULAR | Status: AC
  Filled 2014-06-29: qty 10

## 2014-06-29 MED ORDER — CLINDAMYCIN PHOSPHATE 900 MG/50ML IV SOLN
900.0000 mg | Freq: Three times a day (TID) | INTRAVENOUS | Status: DC
  Filled 2014-06-29 (×2): qty 50

## 2014-06-29 MED ORDER — CHLOROPROCAINE HCL 1 % IJ SOLN
INTRAMUSCULAR | Status: AC
  Filled 2014-06-29: qty 30

## 2014-06-29 MED ORDER — CHLOROPROCAINE HCL 1 % IJ SOLN
INTRAMUSCULAR | Status: DC | PRN
  Administered 2014-06-29: 30 mL

## 2014-06-29 MED ORDER — ZOLPIDEM TARTRATE 5 MG PO TABS: 5.0000 mg | ORAL_TABLET | Freq: Every evening | ORAL | Status: DC | PRN

## 2014-06-29 MED ORDER — SCOPOLAMINE 1 MG/3DAYS TD PT72
1.0000 | MEDICATED_PATCH | Freq: Once | TRANSDERMAL | Status: DC
  Filled 2014-06-29: qty 1

## 2014-06-29 MED ORDER — ONDANSETRON HCL 4 MG/2ML IJ SOLN: 4.0000 mg | Freq: Three times a day (TID) | INTRAMUSCULAR | Status: DC | PRN

## 2014-06-29 MED ORDER — OXYCODONE-ACETAMINOPHEN 5-325 MG PO TABS: 1.0000 | ORAL_TABLET | ORAL | Status: DC | PRN

## 2014-06-29 MED ORDER — MORPHINE SULFATE (PF) 0.5 MG/ML IJ SOLN
INTRAMUSCULAR | Status: DC | PRN
  Administered 2014-06-29 (×2): .5 mg via EPIDURAL
  Administered 2014-06-29: 4 mg via EPIDURAL

## 2014-06-29 MED ORDER — KETOROLAC TROMETHAMINE 30 MG/ML IJ SOLN
30.0000 mg | Freq: Four times a day (QID) | INTRAMUSCULAR | Status: DC | PRN
Start: 2014-06-29 — End: 2014-06-30

## 2014-06-29 MED ORDER — MEPERIDINE HCL 25 MG/ML IJ SOLN
INTRAMUSCULAR | Status: AC
  Filled 2014-06-29: qty 1

## 2014-06-29 SURGICAL SUPPLY — 32 items
BENZOIN TINCTURE PRP APPL 2/3 (GAUZE/BANDAGES/DRESSINGS) IMPLANT
CLAMP CORD UMBIL (MISCELLANEOUS) ×2 IMPLANT
CLOTH BEACON ORANGE TIMEOUT ST (SAFETY) ×2 IMPLANT
DRAPE SHEET LG 3/4 BI-LAMINATE (DRAPES) ×2 IMPLANT
DRSG OPSITE POSTOP 4X10 (GAUZE/BANDAGES/DRESSINGS) ×2 IMPLANT
DURAPREP 26ML APPLICATOR (WOUND CARE) ×2 IMPLANT
ELECT REM PT RETURN 9FT ADLT (ELECTROSURGICAL) ×2
ELECTRODE REM PT RTRN 9FT ADLT (ELECTROSURGICAL) ×1 IMPLANT
EXTRACTOR VACUUM KIWI (MISCELLANEOUS) IMPLANT
GLOVE BIO SURGEON ST LM GN SZ9 (GLOVE) ×2 IMPLANT
GLOVE BIOGEL PI IND STRL 9 (GLOVE) ×1 IMPLANT
GLOVE BIOGEL PI INDICATOR 9 (GLOVE) ×1
GOWN STRL REUS W/TWL 2XL LVL3 (GOWN DISPOSABLE) ×2 IMPLANT
GOWN STRL REUS W/TWL LRG LVL3 (GOWN DISPOSABLE) ×2 IMPLANT
NEEDLE HYPO 25X5/8 SAFETYGLIDE (NEEDLE) ×2 IMPLANT
NS IRRIG 1000ML POUR BTL (IV SOLUTION) ×4 IMPLANT
PACK C SECTION WH (CUSTOM PROCEDURE TRAY) ×2 IMPLANT
PAD OB MATERNITY 4.3X12.25 (PERSONAL CARE ITEMS) ×2 IMPLANT
RTRCTR C-SECT PINK 25CM LRG (MISCELLANEOUS) ×2 IMPLANT
RTRCTR C-SECT PINK 34CM XLRG (MISCELLANEOUS) IMPLANT
STRIP CLOSURE SKIN 1/2X4 (GAUZE/BANDAGES/DRESSINGS) IMPLANT
SUT MNCRL 0 VIOLET CTX 36 (SUTURE) ×3 IMPLANT
SUT MONOCRYL 0 CTX 36 (SUTURE) ×3
SUT VIC AB 0 CT1 27 (SUTURE) ×1
SUT VIC AB 0 CT1 27XBRD ANBCTR (SUTURE) ×1 IMPLANT
SUT VIC AB 2-0 CT1 27 (SUTURE) ×1
SUT VIC AB 2-0 CT1 TAPERPNT 27 (SUTURE) ×1 IMPLANT
SUT VIC AB 4-0 KS 27 (SUTURE) ×2 IMPLANT
SYR BULB IRRIGATION 50ML (SYRINGE) IMPLANT
TOWEL OR 17X24 6PK STRL BLUE (TOWEL DISPOSABLE) ×2 IMPLANT
TRAY FOLEY CATH 14FR (SET/KITS/TRAYS/PACK) ×2 IMPLANT
YANKAUER SUCT BULB TIP NO VENT (SUCTIONS) ×2 IMPLANT

## 2014-06-29 NOTE — Anesthesia Procedure Notes (Signed)
Epidural Patient location during procedure: OB  Staffing Anesthesiologist: Crystal Ellwood, CHRIS Performed by: anesthesiologist   Preanesthetic Checklist Completed: patient identified, surgical consent, pre-op evaluation, timeout performed, IV checked, risks and benefits discussed and monitors and equipment checked  Epidural Patient position: sitting Prep: site prepped and draped and DuraPrep Patient monitoring: heart rate, cardiac monitor, continuous pulse ox and blood pressure Approach: midline Location: L4-L5 Injection technique: LOR saline  Needle:  Needle type: Tuohy  Needle gauge: 17 G Needle length: 9 cm Needle insertion depth: 7 cm Catheter type: closed end flexible Catheter size: 19 Gauge Catheter at skin depth: 14 cm Test dose: negative and 1.5% lidocaine with Epi 1:200 K  Assessment Events: blood not aspirated, injection not painful, no injection resistance, negative IV test and no paresthesia  Additional Notes H+P and labs checked, risks and benefits discussed with the patient, consent obtained, procedure tolerated well and without complications.  Reason for block:procedure for pain   

## 2014-06-29 NOTE — Anesthesia Preprocedure Evaluation (Addendum)
Anesthesia Evaluation  Patient identified by MRN, date of birth, ID band Patient awake    Reviewed: Allergy & Precautions, NPO status , Patient's Chart, lab work & pertinent test results  History of Anesthesia Complications Negative for: history of anesthetic complications  Airway Mallampati: II  TM Distance: >3 FB Neck ROM: Full    Dental  (+) Teeth Intact   Pulmonary neg shortness of breath, neg sleep apnea, neg COPDneg recent URI, former smoker,          Cardiovascular negative cardio ROS  Rhythm:Regular     Neuro/Psych Anxiety negative neurological ROS     GI/Hepatic negative GI ROS, Neg liver ROS,   Endo/Other  negative endocrine ROS  Renal/GU negative Renal ROS     Musculoskeletal   Abdominal   Peds  Hematology  (+) anemia ,   Anesthesia Other Findings   Reproductive/Obstetrics (+) Pregnancy                            Anesthesia Physical Anesthesia Plan  ASA: II  Anesthesia Plan: Epidural   Post-op Pain Management:    Induction:   Airway Management Planned: Natural Airway  Additional Equipment: None  Intra-op Plan:   Post-operative Plan:   Informed Consent: I have reviewed the patients History and Physical, chart, labs and discussed the procedure including the risks, benefits and alternatives for the proposed anesthesia with the patient or authorized representative who has indicated his/her understanding and acceptance.   Dental advisory given  Plan Discussed with: CRNA and Surgeon  Anesthesia Plan Comments:        Anesthesia Quick Evaluation

## 2014-06-29 NOTE — Progress Notes (Signed)
Mom stated that she last fed the baby at 6:10pm,  Education was done with mom on the importance of feeding the baby every 3 hours.  Question mom about most recent feeding and she stated that the baby is sleeping currently and that she is very tired to feed him at the moment.

## 2014-06-29 NOTE — Progress Notes (Signed)
ANTIBIOTIC CONSULT NOTE - INITIAL  Pharmacy Consult for Gentamicin Indication: Chorioamnionitis  Allergies  Allergen Reactions  . Guaifenesin & Derivatives Hives    Patient Measurements: Height: 5\' 8"  (172.7 cm) Weight: 194 lb (87.998 kg) IBW/kg (Calculated) : 63.9 Adjusted Body Weight: 71.2 kg  Vital Signs: Temp: 98.6 F (37 C) (02/27 0630) Temp Source: Oral (02/27 0132) BP: 111/52 mmHg (02/27 0630) Pulse Rate: 104 (02/27 0630) Intake/Output from previous day: 02/26 0701 - 02/27 0700 In: 2365 [I.V.:2365] Out: 950 [Urine:50; Blood:900] Intake/Output from this shift:    Labs:  Recent Labs  06/28/14 2121 06/29/14 0539  WBC 17.3* 18.3*  HGB 9.8* 7.7*  PLT 146* 140*   CrCl cannot be calculated (Patient has no serum creatinine result on file.). No results for input(s): VANCOTROUGH, VANCOPEAK, VANCORANDOM, GENTTROUGH, GENTPEAK, GENTRANDOM, TOBRATROUGH, TOBRAPEAK, TOBRARND, AMIKACINPEAK, AMIKACINTROU, AMIKACIN in the last 72 hours.   Microbiology: Recent Results (from the past 720 hour(s))  GC/Chlamydia Probe Amp     Status: None   Collection Time: 06/11/14  4:12 PM  Result Value Ref Range Status   CT Probe RNA NEGATIVE  Final   GC Probe RNA NEGATIVE  Final    Comment:                                                                                         **Normal Reference Range: Negative**         Assay performed using the Gen-Probe APTIMA COMBO2 (R) Assay.   Acceptable specimen types for this assay include APTIMA Swabs (Unisex, endocervical, urethral, or vaginal), first void urine, and ThinPrep liquid based cytology samples.   Culture, beta strep (group b only)     Status: None   Collection Time: 06/11/14  4:12 PM  Result Value Ref Range Status   Organism ID, Bacteria GROUP B STREP ([email or web address]) ISOLATED  Final    Comment: Testing against S. agalactiae not routinely performed due to predictability of AMP/PEN/VAN susceptibility.   Wet prep, genital      Status: Abnormal   Collection Time: 06/28/14  7:40 PM  Result Value Ref Range Status   Yeast Wet Prep HPF POC NONE SEEN NONE SEEN Final   Trich, Wet Prep NONE SEEN NONE SEEN Final   Clue Cells Wet Prep HPF POC NONE SEEN NONE SEEN Final   WBC, Wet Prep HPF POC TOO NUMEROUS TO COUNT (A) NONE SEEN Final    Comment: MANY BACTERIA SEEN    Medical History: Past Medical History  Diagnosis Date  . Anxiety   . Heart murmur     Medications:  Cefazolin 2 Gm IV x one pre-op dose at 0330 06/29/2014  Assessment: 29 yo G2P2002 at 7338w5dwith chorioamnionitis, now s/p cesarean section who is to be started on gentamicin and clindamycin for 24 hours.  Goal of Therapy:  Gentamicin peaks 6-8 mcg/ml; troughs <1 mcg/ml  Plan:  Gentamicin 180 mg IV every 8 hours to be given with clindamycin for 24 hours post-op.  Jo Rios, Jo Rios Jo Rios 06/29/2014,7:33 AM

## 2014-06-29 NOTE — Op Note (Signed)
Lamija Freeze PROCEDURE DATE: 06/29/2014  PREOPERATIVE DIAGNOSES: Intrauterine pregnancy at [redacted]w[redacted]d weeks gestation; nonreassuring fetal heart tones, suspected marginal abruption  POSTOPERATIVE DIAGNOSES: The same, chorioamnionitis, terminal meconium, occiput posterior  PROCEDURE: Primary Low Transverse Cesarean Section  SURGEON:  Dr. Christin Bach  ASSISTANT:  Dr. Fredirick Lathe  ANESTHESIOLOGIST: Dr. Corky Sox  INDICATIONS: Jo Rios is a 29 y.o. [id number] at [redacted]w[redacted]d here for cesarean section secondary to the indications listed under preoperative diagnoses; please see preoperative note for further details.  Patient presented for labor evaluation subsequently had vaginal bleeding while in MAU, marginal abruption suspected.  Once pitocin started for augmentation recurrent lates noted.  Patient was afebrile.  The risks of cesarean section were discussed with the patient including but were not limited to: bleeding which may require transfusion or reoperation; infection which may require antibiotics; injury to bowel, bladder, ureters or other surrounding organs; injury to the fetus; need for additional procedures including hysterectomy in the event of a life-threatening hemorrhage; placental abnormalities wth subsequent pregnancies, incisional problems, thromboembolic phenomenon and other postoperative/anesthesia complications.   The patient concurred with the proposed plan, giving informed written consent for the procedure.    FINDINGS:  at 3:54 AM a viable female was delivered via C-Section, Low Transverse (Presentation: ;  ).  APGAR: 8, 9; weight.  Clear amniotic fluid.  Intact placenta, three vessel cord.  Normal uterus, fallopian tubes and ovaries bilaterally.  ANESTHESIA: epidural INTRAVENOUS FLUIDS: 1300 ml ESTIMATED BLOOD LOSS: 900 ml URINE OUTPUT:  50 ml SPECIMENS: Placenta sent to pathology COMPLICATIONS: None immediate  PROCEDURE IN DETAIL:  The patient preoperatively received  intravenous antibiotics and had sequential compression devices applied to her lower extremities.  She was then taken to the operating room where the epidural anesthesia was dosed up to surgical level and was found to be adequate. She was then placed in a dorsal supine position with a leftward tilt, and prepped and draped in a sterile manner.  A foley catheter was placed into her bladder and attached to constant gravity.  After an adequate timeout was performed, a Pfannenstiel skin incision was made with scalpel and carried through to the underlying layer of fascia. The fascia was incised in the midline, and this incision was extended bilaterally using the Mayo scissors.  Kocher clamps were applied to the superior aspect of the fascial incision and the underlying rectus muscles were dissected off bluntly. A similar process was carried out on the inferior aspect of the fascial incision. The rectus muscles were separated in the midline bluntly and the peritoneum was entered bluntly. Attention was turned to the lower uterine segment where a low transverse hysterotomy was made with a scalpel and extended bilaterally bluntly.  Once uterus entered foul smelling odor dominated OR.  The infant was successfully delivered, the cord was clamped and cut and the infant was handed over to awaiting neonatology team. Uterine massage was then administered, and the placenta delivered intact with a three-vessel cord. The uterus was then cleared of clot and debris, irrigated. The hysterotomy was closed with 0 Vicryl in a running locked fashion including bilateral lateral extensions, and an imbricating layer was also placed with 0 Vicryl. The pelvis was cleared of all clot and debris. Hemostasis was confirmed on all surfaces.  The peritoneum and the muscles were reapproximated using 0 Vicryl interrupted stitches. The fascia was then closed using 0 Vicryl in a running fashion.  The subcutaneous layer was irrigated, then reapproximated  with 2-0 plain gut interrupted stitches.  The skin was closed with a 4-0 Vicryl subcuticular stitch. The patient tolerated the procedure well. Sponge, lap, instrument and needle counts were correct x 2.  She was taken to the recovery room in stable condition.   Fredirick LatheKristy Armel Rabbani OB Fellow Faculty Practice, [facility]

## 2014-06-29 NOTE — Progress Notes (Signed)
Clinical Social Work Department PSYCHOSOCIAL ASSESSMENT - MATERNAL/CHILD 06/29/2014  Patient:  Jo Rios, Jo Rios  Account Number:  [id number]  [place] Date:  06/28/2014  Ardine Eng Name:   Jo Rios    Clinical Social Worker:  Jo Hornback, LCSW   Date/Time:  06/29/2014 11:30 AM  Date Referred:  06/28/2014   Referral source  Central Nursery     Referred reason  Depression/Anxiety   Other referral source:    I:  FAMILY / [place] legal guardian:  PARENT  Guardian - Name Jo Rios.  [place], [place] [address]  Jo Rios  same as above   Other household support members/support persons Other support:    II  PSYCHOSOCIAL DATA Information Source:    Occupational hygienist Employment:   Mother is employed   Museum/gallery curator resources:  Multimedia programmer If [place]:   Other  [place] / Grade:   Maternity Care Coordinator / Child Services Coordination / Early Interventions:  Cultural issues impacting care:    III  STRENGTHS Strengths  Supportive family/friends  Home prepared for Child (including basic supplies)  Adequate Resources   Strength comment:    IV  RISK FACTORS AND CURRENT PROBLEMS Current Problem:       V  SOCIAL WORK ASSESSMENT Acknowledged order for social work consult to assess mother's hx of anxiety.   Met with both parents.  They were pleasant and receptive to social work intervention. Parents not married but reside together.   FOB was very attentive to newborn during social work visit.   Mother reports hx of anxiety and states that she was prescribed klonopin, but did not take any during pregnancy and rarely needed to take the medication prior to pregnancy.  She denies current symptoms of depression or anxiety.   She reports no hx of psychiatric hospitalization.  She denies any hx of illicit drug use.   No acute social concerns noted or  reported at this time.  Mother informed of social work Fish farm manager.      VI SOCIAL WORK PLAN Social Work Plan  No Further Intervention Required / No Barriers to Discharge   Type of pt/family education:   PP Depression information and resources   If child protective services report - county:   If child protective services report - date:   Information/referral to community resources comment:   Other social work plan:

## 2014-06-29 NOTE — Progress Notes (Signed)
Wasted 1 mg of Ativan in sink. Witnessed by D. Tiburcio PeaHarris, RNC.

## 2014-06-29 NOTE — Progress Notes (Signed)
LABOR PROGRESS NOTE  Jo Rios is a 29 y.o. G2P1001 at 4765w6d  admitted for early labor with suspected marginal abruption  Subjective: Comfortable with epidural  Objective: BP 104/65 mmHg  Pulse 90  Temp(Src) 99 F (37.2 C) (Oral)  Resp 16  Ht 5\' 8"  (1.727 m)  Wt 194 lb (87.998 kg)  BMI 29.50 kg/m2  LMP 10/04/2013 or  Filed Vitals:   06/29/14 0132 06/29/14 0200 06/29/14 0230 06/29/14 0243  BP: 90/39 91/52 90/43  104/65  Pulse: 87 84 88 90  Temp: 99 F (37.2 C)     TempSrc: Oral     Resp: 18 16 16    Height:      Weight:        Dilation: 4 Effacement (%): 60 Station: -2 Presentation: Vertex (determined by bedside U/S) Exam by:: Dr. Richarda BladeAdamo  Labs: Lab Results  Component Value Date   WBC 17.3* 06/28/2014   HGB 9.8* 06/28/2014   HCT 29.9* 06/28/2014   MCV 85.4 06/28/2014   PLT 146* 06/28/2014    Patient Active Problem List   Diagnosis Date Noted  . Placental abruption in third trimester 06/28/2014  . Hidradenitis suppurativa 02/07/2014  . Supervision of low-risk pregnancy 02/07/2014  . Anxiety 12/11/2010    Assessment / Plan: 29 y.o. G2P1001 at 4165w6d here for early latent labor with marginal abruption  Labor: pitocin 2mu d/c Fetal Wellbeing:  Cat II with recurrent late decelerations despite volume resuscitation and d/c pit.  Given suspected marginal abruption => to OR for urgent c/s Pain Control:  epidural Anticipated MOD:  C/s  The risks of cesarean section discussed with the patient included but were not limited to: bleeding which may require transfusion or reoperation; infection which may require antibiotics; injury to bowel, bladder, ureters or other surrounding organs; injury to the fetus; need for additional procedures including hysterectomy in the event of a life-threatening hemorrhage; placental abnormalities wth subsequent pregnancies, incisional problems, thromboembolic phenomenon and other postoperative/anesthesia complications. The patient  concurred with the proposed plan, giving informed written consent for the procedure.   She will remain NPO for procedure. Anesthesia and OR aware. Preoperative prophylactic antibiotics and SCDs ordered on call to the OR.  To OR when ready.   Perry MountACOSTA,[name], MD 06/29/2014, 3:09 AM

## 2014-06-29 NOTE — Addendum Note (Signed)
Addendum  created 06/29/14 0911 by Graciela HusbandsWynn O Franklin Clapsaddle, CRNA   Modules edited: Notes Section   Notes Section:  File: [id number]

## 2014-06-29 NOTE — Transfer of Care (Signed)
Immediate Anesthesia Transfer of Care Note  Patient: Jo Rios  Procedure(s) Performed: Procedure(s): CESAREAN SECTION (N/A)  Patient Location: PACU  Anesthesia Type:Epidural  Level of Consciousness: awake, alert  and oriented  Airway & Oxygen Therapy: Patient Spontanous Breathing  Post-op Assessment: Report given to RN and Post -op Vital signs reviewed and stable  Post vital signs: Reviewed and stable  Last Vitals:  Filed Vitals:   06/29/14 0300  BP: 106/65  Pulse: 92  Temp:   Resp: 16    Complications: No apparent anesthesia complications

## 2014-06-29 NOTE — Anesthesia Postprocedure Evaluation (Signed)
  Anesthesia Post-op Note  Patient: [name]  Procedure(s) Performed: Procedure(s): CESAREAN SECTION (N/A)  Patient Location: PACU  Anesthesia Type:Epidural  Level of Consciousness: awake  Airway and Oxygen Therapy: Patient Spontanous Breathing  Post-op Pain: none  Post-op Assessment: Post-op Vital signs reviewed, Patient's Cardiovascular Status Stable, Respiratory Function Stable, Patent Airway, No signs of Nausea or vomiting and Pain level controlled  Post-op Vital Signs: Reviewed and stable  Last Vitals:  Filed Vitals:   06/29/14 0600  BP: 110/52  Pulse: 104  Temp: 37.8 C  Resp: 20    Complications: No apparent anesthesia complications

## 2014-06-29 NOTE — Lactation Note (Signed)
This note was copied from the chart of Boy Gilman ButtnerJessica Boeve. Lactation Consultation Note  Patient Name: Boy Gilman ButtnerJessica Gwinner ZOXWR'UToday's Date: 06/29/2014 Reason for consult: Initial assessment Baby 19 hours of life. Mom reports that she and baby have had a difficult day. She states that she is not wanting to nurse, but is going to pump and provide breast milk in bottle along with formula. Mom has DEBP in room and states that she has pumped a couple of times today, but doesn't want to pump anymore until tomorrow. Discussed supply and demand, and enc mom to call for assistance as needed.   Maternal Data    Feeding    LATCH Score/Interventions                      Lactation Tools Discussed/Used     Consult Status Consult Status: Follow-up Date: 06/30/14 Follow-up type: In-patient    Geralynn OchsWILLIARD, Tyberius Ryner 06/29/2014, 11:27 PM

## 2014-06-29 NOTE — Anesthesia Postprocedure Evaluation (Signed)
Anesthesia Post Note  Patient: Jo ButtnerJessica Lukach  Procedure(s) Performed: Procedure(s) (LRB): CESAREAN SECTION (N/A)  Anesthesia type: Epidural  Patient location: Mother/Baby  Post pain: Pain level controlled  Post assessment: Post-op Vital signs reviewed  Last Vitals:  Filed Vitals:   06/29/14 0745  BP: 108/50  Pulse: 90  Temp: 37.2 C  Resp: 18    Post vital signs: Reviewed  Level of consciousness:alert  Complications: No apparent anesthesia complications

## 2014-06-30 LAB — TYPE AND SCREEN
ABO/RH(D): O POS
ANTIBODY SCREEN: NEGATIVE
UNIT DIVISION: 0
Unit division: 0

## 2014-06-30 LAB — HIV ANTIBODY (ROUTINE TESTING W REFLEX): HIV Screen 4th Generation wRfx: NONREACTIVE

## 2014-06-30 LAB — CBC
HCT: 18.8 % — ABNORMAL LOW (ref 36.0–46.0)
HEMOGLOBIN: 6.3 g/dL — AB (ref 12.0–15.0)
MCH: 28.4 pg (ref 26.0–34.0)
MCHC: 33.5 g/dL (ref 30.0–36.0)
MCV: 84.7 fL (ref 78.0–100.0)
Platelets: 132 10*3/uL — ABNORMAL LOW (ref 150–400)
RBC: 2.22 MIL/uL — ABNORMAL LOW (ref 3.87–5.11)
RDW: 13.7 % (ref 11.5–15.5)
WBC: 13.9 10*3/uL — ABNORMAL HIGH (ref 4.0–10.5)

## 2014-06-30 LAB — RPR: RPR Ser Ql: NONREACTIVE

## 2014-06-30 NOTE — Progress Notes (Signed)
Mom requested for care to be clustered due to not being able to get sleep.  Mom requested for baby hep B vaccine, assessment, weight to be clustered at the same time as well as moms assessment, vitals, and medications. Staff accommodated request. Dad stated "yall keep coming in here every 5 minutes and you can't get any sleep around here."  Explained to dad that why care was clustered, and he was still upset and stated "I'm going to the car."

## 2014-06-30 NOTE — Progress Notes (Signed)
CRITICAL VALUE ALERT  Critical value received:  Hg 6.3  Date of notification:  06/30/2014  Time of notification:  0937 Critical value read back: yes  Nurse who received alert:  Jamesetta OrleansBrook Jerrika Ledlow RN  MD notified (1st page):  Zerita Boersarlene Lawson CNMW  Time of first page:  na  MD notified (2nd page): na  Time of second page:na   Responding MD: na  Time MD responded:  na

## 2014-06-30 NOTE — Progress Notes (Signed)
Subjective: Postpartum Day #1: Cesarean Delivery Patient reports incisional pain, tolerating PO, + flatus and no problems voiding.  Denies dizziness/lightheadedness.  Objective: Vital signs in last 24 hours: Temp:  [97.9 F (36.6 C)-98.2 F (36.8 C)] 98.2 F (36.8 C) (02/28 0630) Pulse Rate:  [84-108] 84 (02/28 0630) Resp:  [18-20] 20 (02/28 0630) BP: (96-108)/(51-60) 96/52 mmHg (02/28 0630) SpO2:  [97 %-99 %] 99 % (02/27 1755)  Physical Exam:  General: alert, cooperative and mild distress Lochia: appropriate Uterine Fundus: firm @ U+1 Abdomen: soft, tender Incision: no significant drainage, no dehiscence, no significant erythema    Recent Labs  06/29/14 0539 06/30/14 0850  HGB 7.7* 6.3*  HCT 23.0* 18.8*    Assessment/Plan: Status post Cesarean section. Postoperative course complicated by low hemoglobin.  Repeat CBC at 1800.  Airel Magadan Wynne 06/30/2014, 11:16 AM

## 2014-06-30 NOTE — Progress Notes (Signed)
[facility] vital signs/blood pressure and she stated that she will notify the doctor and that they will be rounding on the patient soon.

## 2014-06-30 NOTE — Progress Notes (Addendum)
Subjective: Postpartum Day 1: Cesarean Delivery Patient reports incisional pain, tolerating PO, + flatus and no problems voiding.    Objective: Vital signs in last 24 hours: Temp:  [97.9 F (36.6 C)-98.9 F (37.2 C)] 98.2 F (36.8 C) (02/28 0630) Pulse Rate:  [72-108] 84 (02/28 0630) Resp:  [18-20] 20 (02/28 0630) BP: (96-108)/(50-75) 96/52 mmHg (02/28 0630) SpO2:  [97 %-99 %] 99 % (02/27 1755)  Physical Exam:  General: alert, cooperative and no distress Lochia: appropriate Uterine Fundus: firm Incision: no significant drainage, no significant erythema DVT Evaluation: No evidence of DVT seen on physical exam. No cords or calf tenderness. No significant calf/ankle edema.   Recent Labs  06/28/14 2121 06/29/14 0539  HGB 9.8* 7.7*  HCT 29.9* 23.0*    Assessment/Plan: Status post Cesarean section. Doing well postoperatively. Bottle feeding, undecided bc. Continue current care.  Beverely Lowdamo, Sadey Yandell 06/30/2014, 7:17 AM

## 2014-07-01 LAB — HEMOGLOBIN AND HEMATOCRIT, BLOOD
HEMATOCRIT: 19.8 % — AB (ref 36.0–46.0)
HEMOGLOBIN: 6.6 g/dL — AB (ref 12.0–15.0)

## 2014-07-01 MED ORDER — FLUOXETINE HCL 20 MG PO CAPS
20.0000 mg | ORAL_CAPSULE | Freq: Every day | ORAL | Status: DC
  Administered 2014-07-01 – 2014-07-02 (×2): 20 mg via ORAL
  Filled 2014-07-01 (×2): qty 1

## 2014-07-01 MED ORDER — IBUPROFEN 600 MG PO TABS: 600.0000 mg | ORAL_TABLET | Freq: Four times a day (QID) | ORAL | Status: DC

## 2014-07-01 MED ORDER — CLONAZEPAM 0.5 MG PO TABS: 0.5000 mg | ORAL_TABLET | Freq: Three times a day (TID) | ORAL | Status: DC | PRN

## 2014-07-01 MED ORDER — OXYCODONE-ACETAMINOPHEN 5-325 MG PO TABS
1.0000 | ORAL_TABLET | Freq: Four times a day (QID) | ORAL | Status: DC | PRN
Start: 2014-07-01 — End: 2014-08-05

## 2014-07-01 NOTE — Discharge Summary (Signed)
Obstetric Discharge Summary Reason for Admission: onset of labor and vaginal bleeding Prenatal Procedures: NST and ultrasound Intrapartum Procedures: cesarean: low cervical, transverse and chorio Postpartum Procedures: none Complications-Operative and Postpartum: chorio/endometritis, ABLA asymptomatic HEMOGLOBIN  Date Value Ref Range Status  06/30/2014 6.6* 12.0 - 15.0 g/dL Final    Comment:    REPEATED TO VERIFY CRITICAL RESULT CALLED TO, READ BACK BY AND VERIFIED WITH: DEBBIE NIX 06/30/14 BY A POTEAT    HCT  Date Value Ref Range Status  06/30/2014 19.8* 36.0 - 46.0 % Final    Physical Exam:  General: alert, cooperative and no distress Lochia: appropriate Uterine Fundus: firm Incision: no significant drainage, no significant erythema DVT Evaluation: No evidence of DVT seen on physical exam. No cords or calf tenderness. No significant calf/ankle edema.  Discharge Diagnoses: Term Pregnancy-delivered and Amnionitis  Discharge Information: Date: 07/01/2014 Activity: pelvic rest Diet: routine Medications: PNV, Ibuprofen and Percocet Condition: stable Instructions: refer to practice specific booklet Discharge to: home  Follow-up Information    [facility] [place]. Schedule an appointment as soon as possible for a visit in 2 weeks.   Specialty:  Obstetrics and Gynecology   Why:  For wound re-check   Contact information:   [address], [address] [place] [id number] [phone]      Newborn Data: Live born female  Birth Weight: 5 lb 15.2 oz (2700 g) APGAR: 8, 9  Home with mother.  Jo Rios, Jo Rios 07/01/2014, 6:55 AM

## 2014-07-01 NOTE — Discharge Instructions (Signed)
Postpartum Care After Cesarean Delivery °After you deliver your newborn (postpartum period), the usual stay in the hospital is 24-72 hours. If there were problems with your labor or delivery, or if you have other medical problems, you might be in the hospital longer.  °While you are in the hospital, you will receive help and instructions on how to care for yourself and your newborn during the postpartum period.  °While you are in the hospital: °· It is normal for you to have pain or discomfort from the incision in your abdomen. Be sure to tell your nurses when you are having pain, where the pain is located, and what makes the pain worse. °· If you are breastfeeding, you may feel uncomfortable contractions of your uterus for a couple of weeks. This is normal. The contractions help your uterus get back to normal size. °· It is normal to have some bleeding after delivery. °· For the first 1-3 days after delivery, the flow is red and the amount may be similar to a period. °· It is common for the flow to start and stop. °· In the first few days, you may pass some small clots. Let your nurses know if you begin to pass large clots or your flow increases. °· Do not  flush blood clots down the toilet before having the nurse look at them. °· During the next 3-10 days after delivery, your flow should become more watery and pink or brown-tinged in color. °· Ten to fourteen days after delivery, your flow should be a small amount of yellowish-white discharge. °· The amount of your flow will decrease over the first few weeks after delivery. Your flow may stop in 6-8 weeks. Most women have had their flow stop by 12 weeks after delivery. °· You should change your sanitary pads frequently. °· Wash your hands thoroughly with soap and water for at least 20 seconds after changing pads, using the toilet, or before holding or feeding your newborn. °· Your intravenous (IV) tubing will be removed when you are drinking enough fluids. °· The  urine drainage tube (urinary catheter) that was inserted before delivery may be removed within 6-8 hours after delivery or when feeling returns to your legs. You should feel like you need to empty your bladder within the first 6-8 hours after the catheter has been removed. °· In case you become weak, lightheaded, or faint, call your nurse before you get out of bed for the first time and before you take a shower for the first time. °· Within the first few days after delivery, your breasts may begin to feel tender and full. This is called engorgement. Breast tenderness usually goes away within 48-72 hours after engorgement occurs. You may also notice milk leaking from your breasts. If you are not breastfeeding, do not stimulate your breasts. Breast stimulation can make your breasts produce more milk. °· Spending as much time as possible with your newborn is very important. During this time, you and your newborn can feel close and get to know each other. Having your newborn stay in your room (rooming in) will help to strengthen the bond with your newborn. It will give you time to get to know your newborn and become comfortable caring for your newborn. °· Your hormones change after delivery. Sometimes the hormone changes can temporarily cause you to feel sad or tearful. These feelings should not last more than a few days. If these feelings last longer than that, you should talk to your   caregiver. °· If desired, talk to your caregiver about methods of family planning or contraception. °· Talk to your caregiver about immunizations. Your caregiver may want you to have the following immunizations before leaving the hospital: °· Tetanus, diphtheria, and pertussis (Tdap) or tetanus and diphtheria (Td) immunization. It is very important that you and your family (including grandparents) or others caring for your newborn are up-to-date with the Tdap or Td immunizations. The Tdap or Td immunization can help protect your newborn  from getting ill. °· Rubella immunization. °· Varicella (chickenpox) immunization. °· Influenza immunization. You should receive this annual immunization if you did not receive the immunization during your pregnancy. °Document Released: 01/12/2012 Document Reviewed: 01/12/2012 °ExitCare® Patient Information ©2015 [employer]. This information is not intended to replace advice given to you by your health care provider. Make sure you discuss any questions you have with your health care provider. ° °Iron-Rich Diet °An iron-rich diet contains foods that are good sources of iron. Iron is an important mineral that helps your body produce hemoglobin. Hemoglobin is a protein in red blood cells that carries oxygen to the body's tissues. Sometimes, the iron level in your blood can be low. This may be caused by: °· A lack of iron in your diet. °· Blood loss. °· Times of growth, such as during pregnancy or during a child's growth and development. °Low levels of iron can cause a decrease in the number of red blood cells. This can result in iron deficiency anemia. Iron deficiency anemia symptoms include: °· Tiredness. °· Weakness. °· Irritability. °· Increased chance of infection. °Here are some recommendations for daily iron intake: °· Males older than 31 years of age need 8 mg of iron per day. °· Women ages 19 to 50 need 18 mg of iron per day. °· Pregnant women need 27 mg of iron per day, and women who are over 19 years of age and breastfeeding need 9 mg of iron per day. °· Women over the age of 50 need 8 mg of iron per day. °SOURCES OF IRON °There are 2 types of iron that are found in food: heme iron and nonheme iron. Heme iron is absorbed by the body better than nonheme iron. Heme iron is found in meat, poultry, and fish. Nonheme iron is found in grains, beans, and vegetables. °Heme Iron Sources °Food / Iron (mg) °· Chicken liver, 3 oz (85 g)/ 10 mg °· Beef liver, 3 oz (85 g)/ 5.5 mg °· Oysters, 3 oz (85 g)/ 8 mg °· Beef, 3  oz (85 g)/ 2 to 3 mg °· Shrimp, 3 oz (85 g)/ 2.8 mg °· Turkey, 3 oz (85 g)/ 2 mg °· Chicken, 3 oz (85 g) / 1 mg °· Fish (tuna, halibut), 3 oz (85 g)/ 1 mg °· Pork, 3 oz (85 g)/ 0.9 mg °Nonheme Iron Sources °Food / Iron (mg) °· Ready-to-eat breakfast cereal, iron-fortified / 3.9 to 7 mg °· Tofu, ½ cup / 3.4 mg °· Kidney beans, ½ cup / 2.6 mg °· Baked potato with skin / 2.7 mg °· Asparagus, ½ cup / 2.2 mg °· Avocado / 2 mg °· Dried peaches, ½ cup / 1.6 mg °· Raisins, ½ cup / 1.5 mg °· Soy milk, 1 cup / 1.5 mg °· Whole-wheat bread, 1 slice / 1.2 mg °· Spinach, 1 cup / 0.8 mg °· Broccoli, ½ cup / 0.6 mg °IRON ABSORPTION °Certain foods can decrease the body's absorption of iron. Try to avoid these foods and   beverages while eating meals with iron-containing foods: °· Coffee. °· Tea. °· Fiber. °· Soy. °Foods containing vitamin C can help increase the amount of iron your body absorbs from iron sources, especially from nonheme sources. Eat foods with vitamin C along with iron-containing foods to increase your iron absorption. Foods that are high in vitamin C include many fruits and vegetables. Some good sources are: °· Fresh orange juice. °· Oranges. °· Strawberries. °· Mangoes. °· Grapefruit. °· Red bell peppers. °· Green bell peppers. °· Broccoli. °· Potatoes with skin. °· Tomato juice. °Document Released: 12/01/2004 Document Revised: 07/12/2011 Document Reviewed: 10/08/2010 °ExitCare® Patient Information ©2015 [employer]. This information is not intended to replace advice given to you by your health care provider. Make sure you discuss any questions you have with your health care provider. ° °

## 2014-07-01 NOTE — Progress Notes (Signed)
Went to evaluate patient for possible blood transfusion, she was very tearful intermittently.  Stressed out about incision and possible future pain, about her anxiety, about crying spontaneously, about mother not being here right now, about several things.  She would like to take something for anxiety, previously on klonopin.  Feels SOB when ambulatory, but no palpitations, dizziness upon standing.  At this time feels SOB is more anxiety and does not want a blood transfusion - start prozac 20mg  - klonopin 0.5mg  prn TID  => happy with plan, discharge tomorrow  Perry MountACOSTA,Lochlan Grygiel ROCIO, MD 9:22 AM

## 2014-07-01 NOTE — Progress Notes (Signed)
Subjective: Postpartum Day 2: Cesarean Delivery Patient reports incisional pain, tolerating PO and no problems voiding.    Objective: Vital signs in last 24 hours: Temp:  [98 F (36.7 C)-98.1 F (36.7 C)] 98 F (36.7 C) (02/29 0559) Pulse Rate:  [93-95] 95 (02/29 0559) Resp:  [16-18] 16 (02/29 0559) BP: (101-117)/(55) 101/55 mmHg (02/29 0559)  Physical Exam:  General: alert, cooperative, appears older than stated age, no distress and pale Lochia: appropriate Uterine Fundus: firm Incision: healing well, no significant drainage, no dehiscence, no significant erythema DVT Evaluation: No evidence of DVT seen on physical exam. Negative Homan's sign. No cords or calf tenderness. No significant calf/ankle edema.   Recent Labs  06/30/14 0850 06/30/14 1343  HGB 6.3* 6.6*  HCT 18.8* 19.8*    Assessment/Plan: Status post Cesarean section. Postoperative course complicated by low hgb  Continue current care.  Wyvonnia DuskyLAWSON, Lynette Topete DARLENE 07/01/2014, 7:17 AM

## 2014-07-01 NOTE — Progress Notes (Signed)
Ur chart review completed.  

## 2014-07-02 ENCOUNTER — Encounter (HOSPITAL_COMMUNITY): Payer: Self-pay | Admitting: Obstetrics and Gynecology

## 2014-07-02 MED ORDER — FLUOXETINE HCL 20 MG PO CAPS: 20.0000 mg | ORAL_CAPSULE | Freq: Every day | ORAL | Status: AC

## 2014-07-02 NOTE — Discharge Summary (Signed)
Obstetric Discharge Summary Reason for Admission:cesarean section, vaginal bleeding and non-reassuring fetal heart tones Prenatal Procedures: NST Intrapartum Procedures: cesarean: low cervical, transverse Postpartum Procedures: antibiotics Complications-Operative and Postpartum: chorioamnionitis  Delivery Note PREOPERATIVE DIAGNOSES: Intrauterine pregnancy at [redacted]w[redacted]d weeks gestation; nonreassuring fetal heart tones, suspected marginal abruption  POSTOPERATIVE DIAGNOSES: The same, chorioamnionitis, terminal meconium, occiput posterior  PROCEDURE: Primary Low Transverse Cesarean Section  SURGEON: Dr. Christin Bach  ASSISTANT: Dr. Fredirick Lathe  ANESTHESIOLOGIST: Dr. Corky Sox  INDICATIONS: Jo Rios is a 29 y.o. [id number] at [redacted]w[redacted]d here for cesarean section secondary to the indications listed under preoperative diagnoses; please see preoperative note for further details. Patient presented for labor evaluation subsequently had vaginal bleeding while in MAU, marginal abruption suspected. Once pitocin started for augmentation recurrent lates noted. Patient was afebrile. The risks of cesarean section were discussed with the patient including but were not limited to: bleeding which may require transfusion or reoperation; infection which may require antibiotics; injury to bowel, bladder, ureters or other surrounding organs; injury to the fetus; need for additional procedures including hysterectomy in the event of a life-threatening hemorrhage; placental abnormalities wth subsequent pregnancies, incisional problems, thromboembolic phenomenon and other postoperative/anesthesia complications. The patient concurred with the proposed plan, giving informed written consent for the procedure.   FINDINGS: at 3:54 AM a viable female was delivered via C-Section, Low Transverse (Presentation: ; ). APGAR: 8, 9; weight. Clear amniotic fluid. Intact placenta, three vessel cord. Normal uterus,  fallopian tubes and ovaries bilaterally.  ANESTHESIA: epidural INTRAVENOUS FLUIDS: 1300 ml ESTIMATED BLOOD LOSS: 900 ml URINE OUTPUT: 50 ml SPECIMENS: Placenta sent to pathology COMPLICATIONS: None immediate  PROCEDURE IN DETAIL: The patient preoperatively received intravenous antibiotics and had sequential compression devices applied to her lower extremities. She was then taken to the operating room where the epidural anesthesia was dosed up to surgical level and was found to be adequate. She was then placed in a dorsal supine position with a leftward tilt, and prepped and draped in a sterile manner. A foley catheter was placed into her bladder and attached to constant gravity. After an adequate timeout was performed, a Pfannenstiel skin incision was made with scalpel and carried through to the underlying layer of fascia. The fascia was incised in the midline, and this incision was extended bilaterally using the Mayo scissors. Kocher clamps were applied to the superior aspect of the fascial incision and the underlying rectus muscles were dissected off bluntly. A similar process was carried out on the inferior aspect of the fascial incision. The rectus muscles were separated in the midline bluntly and the peritoneum was entered bluntly. Attention was turned to the lower uterine segment where a low transverse hysterotomy was made with a scalpel and extended bilaterally bluntly. Once uterus entered foul smelling odor dominated OR. The infant was successfully delivered, the cord was clamped and cut and the infant was handed over to awaiting neonatology team. Uterine massage was then administered, and the placenta delivered intact with a three-vessel cord. The uterus was then cleared of clot and debris, irrigated. The hysterotomy was closed with 0 Vicryl in a running locked fashion including bilateral lateral extensions, and an imbricating layer was also placed with 0 Vicryl. The pelvis was cleared of  all clot and debris. Hemostasis was confirmed on all surfaces. The peritoneum and the muscles were reapproximated using 0 Vicryl interrupted stitches. The fascia was then closed using 0 Vicryl in a running fashion. The subcutaneous layer was irrigated, then reapproximated with 2-0 plain gut interrupted  stitches. The skin was closed with a 4-0 Vicryl subcuticular stitch. The patient tolerated the procedure well. Sponge, lap, instrument and needle counts were correct x 2. She was taken to the recovery room in stable condition.   Hospital Course:  Active Problems:   Placental abruption in third trimester   Jo ButtnerJessica Rios is a 29 y.o. [id number] s/p pLTCS.  Patient presented to [facility] 06/28/14 with pTL, vaginal bleeding and non-reassuring fetal heart tones and was admitted to L&D.  She has postpartum course significant for chorioamnionitis detected during pLTCS, treated with postop antibiotics.  Postpartum course complicated by anxiety, no problems ambulating, PO intake, urination, pain or bleeding. The pt feels ready to go home and  will be discharged with outpatient follow-up.   Today: No acute events overnight.  Pt denies problems with ambulating, voiding or po intake.  She denies nausea or vomiting.  Pain is well controlled.  She has had flatus. She has had bowel movement.  Lochia Minimal.  Plan for birth control is  oral contraceptives (estrogen/progesterone).  Method of Feeding: bottle, trying breastfeeding   H/H: Lab Results  Component Value Date/Time   HGB 6.6* 06/30/2014 01:43 PM   HCT 19.8* 06/30/2014 01:43 PM    Discharge Diagnoses: Amnionitis, Antepartum bleeding and Term delivery via CS  Discharge Information: Date: 07/02/2014 Activity: pelvic rest Diet: routine  Medications: Ibuprofen, Colace, Percocet and Prozac Breast feeding:  No: Bottle w/some breastfeeding Condition: stable Instructions: refer to handout Discharge to: home   Discharge Instructions    Increase activity  slowly    Complete by:  As directed             Medication List    TAKE these medications        acetaminophen 325 MG tablet  Commonly known as:  TYLENOL  Take 325 mg by mouth every 6 (six) hours as needed for moderate pain.     ibuprofen 600 MG tablet  Commonly known as:  ADVIL,MOTRIN  Take 1 tablet (600 mg total) by mouth every 6 (six) hours.     ondansetron 4 MG tablet  Commonly known as:  ZOFRAN  Take 1 tablet (4 mg total) by mouth every 8 (eight) hours as needed for nausea or vomiting.     oxyCODONE-acetaminophen 5-325 MG per tablet  Commonly known as:  PERCOCET/ROXICET  Take 1 tablet by mouth every 6 (six) hours as needed for severe pain.     PRENATAL VITAMINS PLUS 27-1 MG Tabs  Take 1 tablet by mouth daily.           Follow-up Information    [facility] [place]. Schedule an appointment as soon as possible for a visit in 2 weeks.   Specialty:  Obstetrics and Gynecology   Why:  For wound re-check   Contact information:   [address], [address] [place] [id number] [phone]      William DaltonMcEachern, Dajon Rowe ,MD OB Fellow 07/02/2014,8:58 AM

## 2014-07-04 ENCOUNTER — Encounter: Payer: 59 | Admitting: Obstetrics & Gynecology

## 2014-07-15 ENCOUNTER — Encounter: Payer: Self-pay | Admitting: *Deleted

## 2014-08-05 ENCOUNTER — Ambulatory Visit (INDEPENDENT_AMBULATORY_CARE_PROVIDER_SITE_OTHER): Payer: 59 | Admitting: Obstetrics & Gynecology

## 2014-08-05 ENCOUNTER — Encounter: Payer: Self-pay | Admitting: Obstetrics & Gynecology

## 2014-08-05 NOTE — Progress Notes (Signed)
  Subjective:     Jo Rios is a 29 y.o. MW 78P1 female who presents for a postpartum visit. She is 5 weeks postpartum following a low cervical transverse Cesarean section. I have fully reviewed the prenatal and intrapartum course. The delivery was at 38.5 gestational weeks. Outcome: primary cesarean section, low transverse incision. Anesthesia: epidural. Postpartum course has been uncomplicated. Baby's course has been uncomplicated. Baby is feeding by bottle - Similac Advance. Bleeding no bleeding. Bowel function is normal. Bladder function is normal. Patient is not sexually active. Contraception method is unsure. Postpartum depression screening: equivical. She was given a prescription for prozac prior to discharge from the hospital, but she never took it. She feels like her mood is definitely improving.   The following portions of the patient's history were reviewed and updated as appropriate: allergies, current medications, past family history, past medical history, past social history, past surgical history and problem list.  Review of Systems A comprehensive review of systems was negative.   Objective:    BP 111/67 mmHg  Pulse 102  Resp 16  Ht 5' 7.75" (1.721 m)  Wt 172 lb (78.019 kg)  BMI 26.34 kg/m2  LMP 07/31/2014  Breastfeeding? No  General:  alert   Breasts:  inspection negative, no nipple discharge or bleeding, no masses or nodularity palpable  Lungs: clear to auscultation bilaterally  Heart:  regular rate and rhythm, S1, S2 normal, no murmur, click, rub or gallop  Abdomen: soft, non-tender; bowel sounds normal; no masses,  no organomegaly   Vulva: not evaluated  Vagina: not evaluated  Cervix:  not evaluated  Corpus: not examined  Adnexa:  not evaluated  Rectal Exam: Not performed.        Assessment:     Normal postpartum exam. Pap smear not done at today's visit.   Plan:    1. Contraception: She will consider the options and we will discuss it again in 2 weeks.  Condoms prn 2.  Baby blues- If her mood continues to improve, she will not need meds. I will re evaluate her in 2 weeks 3. Follow up in: 2 weeks or as needed.

## 2017-01-19 ENCOUNTER — Encounter: Payer: 59 | Admitting: Obstetrics & Gynecology
